# Patient Record
Sex: Male | Born: 1975 | Race: White | Hispanic: No | Marital: Married | Smoking: Never smoker
Health system: Southern US, Community
[De-identification: ages and names within clinical notes are randomized; demographics above are authoritative.]

## PROBLEM LIST (undated history)

## (undated) DIAGNOSIS — N529 Male erectile dysfunction, unspecified: Secondary | ICD-10-CM

## (undated) DIAGNOSIS — E039 Hypothyroidism, unspecified: Secondary | ICD-10-CM

---

## 2009-02-03 ENCOUNTER — Ambulatory Visit: Payer: Self-pay | Admitting: Occupational Medicine

## 2009-02-03 DIAGNOSIS — R109 Unspecified abdominal pain: Secondary | ICD-10-CM | POA: Insufficient documentation

## 2009-02-03 DIAGNOSIS — N200 Calculus of kidney: Secondary | ICD-10-CM

## 2009-02-03 LAB — CONVERTED CEMR LAB
Glucose, Urine, Semiquant: NEGATIVE
Ketones, urine, test strip: NEGATIVE
Nitrite: NEGATIVE
Urobilinogen, UA: 0.2
WBC Urine, dipstick: NEGATIVE

## 2010-01-20 ENCOUNTER — Ambulatory Visit: Payer: Self-pay | Admitting: Family Medicine

## 2010-01-20 DIAGNOSIS — J069 Acute upper respiratory infection, unspecified: Secondary | ICD-10-CM | POA: Insufficient documentation

## 2010-01-20 DIAGNOSIS — K219 Gastro-esophageal reflux disease without esophagitis: Secondary | ICD-10-CM | POA: Insufficient documentation

## 2010-01-20 DIAGNOSIS — J0100 Acute maxillary sinusitis, unspecified: Secondary | ICD-10-CM

## 2010-06-05 ENCOUNTER — Ambulatory Visit: Payer: Self-pay | Admitting: Family Medicine

## 2010-06-05 DIAGNOSIS — M79609 Pain in unspecified limb: Secondary | ICD-10-CM

## 2010-06-18 ENCOUNTER — Encounter: Payer: Self-pay | Admitting: Family Medicine
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
## 2010-12-22 NOTE — Letter (Signed)
Summary: External Correspondence  External Correspondence   Imported By: Dannette Barbara 06/18/2010 10:00:31  _____________________________________________________________________  External Attachment:    Type:   Image     Comment:   External Document

## 2010-12-22 NOTE — Assessment & Plan Note (Signed)
Summary: EARACHE & SORE THROAT/KH   Vital Signs:  Patient Profile:   35 Years Old Male CC:      pressure to bilateral ears, runny nose, productive cough, and HAs X 5 days Height:     72 inches Weight:      191 pounds O2 Sat:      99 % O2 treatment:    Room Air Temp:     97.5 degrees F oral Pulse rate:   83 / minute Pulse rhythm:   regular Resp:     14 per minute BP sitting:   123 / 78  (right arm)  Vitals Entered By: Lajean Saver, RN                  Updated Prior Medication List: NASONEX 50 MCG/ACT SUSP (MOMETASONE FUROATE) once daily  Current Allergies (reviewed today): No known allergies History of Present Illness Chief Complaint: pressure to bilateral ears, runny nose, productive cough, and HAs X 5 days History of Present Illness: Subjective: Patient complains of URI symptoms for one week, starting with sinus congestion followed by mild sore throat.  He has had a seasonal flu shot. + cough for 3 days No pleuritic pain No wheezing + post-nasal drainage + sinus pain/pressure No itchy/red eyes + left earache No hemoptysis No SOB No fever, + chills No nausea No vomiting No abdominal pain No diarrhea No skin rashes + fatigue No myalgias No headache Used OTC meds without relief   REVIEW OF SYSTEMS Constitutional Symptoms       Complains of fatigue.     Denies fever, chills, night sweats, weight loss, and weight gain.  Eyes       Denies change in vision, eye pain, eye discharge, glasses, contact lenses, and eye surgery. Ear/Nose/Throat/Mouth       Complains of ear pain, frequent runny nose, and sinus problems.      Denies hearing loss/aids, change in hearing, ear discharge, dizziness, frequent nose bleeds, sore throat, hoarseness, and tooth pain or bleeding.  Respiratory       Complains of productive cough.      Denies dry cough, wheezing, shortness of breath, asthma, bronchitis, and emphysema/COPD.  Cardiovascular       Denies murmurs, chest pain, and  tires easily with exhertion.    Gastrointestinal       Denies stomach pain, nausea/vomiting, diarrhea, constipation, blood in bowel movements, and indigestion. Genitourniary       Denies painful urination, kidney stones, and loss of urinary control. Neurological       Denies paralysis, seizures, and fainting/blackouts. Musculoskeletal       Denies muscle pain, joint pain, joint stiffness, decreased range of motion, redness, swelling, muscle weakness, and gout.  Skin       Denies bruising, unusual mles/lumps or sores, and hair/skin or nail changes.  Psych       Denies mood changes, temper/anger issues, anxiety/stress, speech problems, depression, and sleep problems.  Past History:  Past Medical History: GERD- mild  Past Surgical History: Reviewed history from 02/03/2009 and no changes required. deviated septum repair Dec 08  Family History: Reviewed history from 02/03/2009 and no changes required. Mother alive age 79 Father alive age 51 Sister alive age 18  Social History: [employer] 1/2 pkg daily x 9 years Denies ETOH or rec drugs   Objective:  Appearance:  Patient appears healthy, stated age, and in no acute distress j Eyes:  Pupils are equal, round, and reactive to light and accomdation.  Extraocular movement is intact.  Conjunctivae are not inflamed.  Ears:  Canals normal.  Tympanic membranes normal.   Nose:  Normal septum.  Normal turbinates, mildly congested.  Maxillary sinus tenderness present.  Pharynx:  Normal  Neck:  Supple.  No adenopathy is present.  No thyromegaly is present  Lungs:  Clear to auscultation.  Breath sounds are equal.  Heart:  Regular rate and rhythm without murmurs, rubs, or gallops.  Abdomen:  Nontender without masses or hepatosplenomegaly.  Bowel sounds are present.  No CVA or flank tenderness.  Assessment New Problems: ACUTE MAXILLARY SINUSITIS (ICD-461.0) URI (ICD-465.9) GERD (ICD-530.81)  Suspect viral URI with secondary  sinusitis  Plan New Medications/Changes: BENZONATATE 200 MG CAPS (BENZONATATE) One by mouth hs as needed cough  #12 x 0, 01/20/2010, Donna Christen MD AZITHROMYCIN 250 MG TABS (AZITHROMYCIN) Two tabs by mouth on day 1, then 1 tab daily on days 2 through 5  #6 tabs x 0, 01/20/2010, Donna Christen MD  New Orders: Est. Patient Level III [phone] Planning Comments:   Begin Z-pack, decongestants, cough suppressant at night if cough worsens. Follow-up with PCP if not improving.   The patient and/or caregiver has been counseled thoroughly with regard to medications prescribed including dosage, schedule, interactions, rationale for use, and possible side effects and they verbalize understanding.  Diagnoses and expected course of recovery discussed and will return if not improved as expected or if the condition worsens. Patient and/or caregiver verbalized understanding.  Prescriptions: BENZONATATE 200 MG CAPS (BENZONATATE) One by mouth hs as needed cough  #12 x 0   Entered and Authorized by:   Donna Christen MD   Signed by:   Donna Christen MD on 01/20/2010   Method used:   Print then Give to Patient   RxID:   [id number] AZITHROMYCIN 250 MG TABS (AZITHROMYCIN) Two tabs by mouth on day 1, then 1 tab daily on days 2 through 5  #6 tabs x 0   Entered and Authorized by:   Donna Christen MD   Signed by:   Donna Christen MD on 01/20/2010   Method used:   Print then Give to Patient   RxID:   [id number]   Patient Instructions: 1)  May use Mucinex D (guaifenesin with decongestant) twice daily for congestion. 2)  Increase fluid intake, rest. 3)  May use Afrin nasal spray (or generic oxymetazoline) twice daily for about 5 days.  Also recommend using saline nasal spray several times daily and/or saline nasal irrigation. 4)  Followup with family doctor if not improving 7 to 10 days.

## 2010-12-23 IMAGING — CR DG FOOT COMPLETE 3+V*L*
3 series · 3 of 3 positions shown · non-contrast
Comparison: None

CLINICAL DATA: Left heel pain.

LEFT FOOT - COMPLETE 3+ VIEW

[view not recorded (1 of 3)]
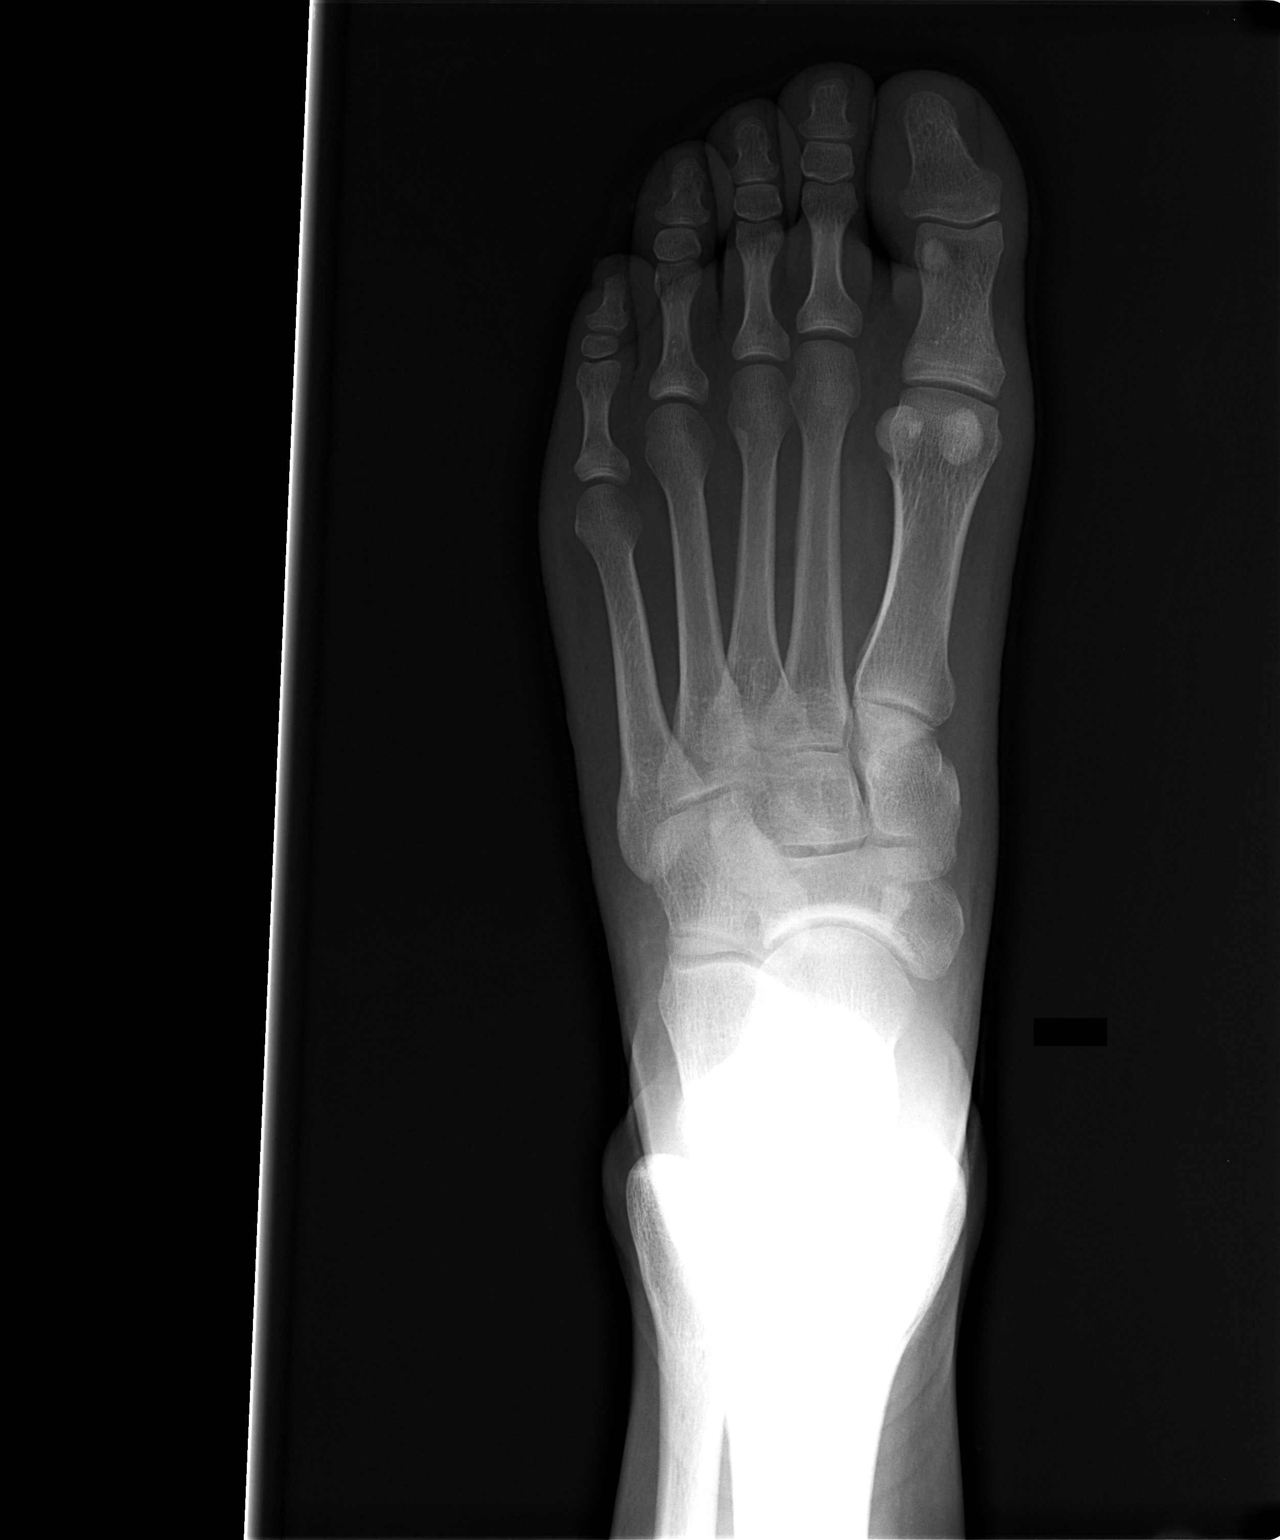

[view not recorded (2 of 3)]
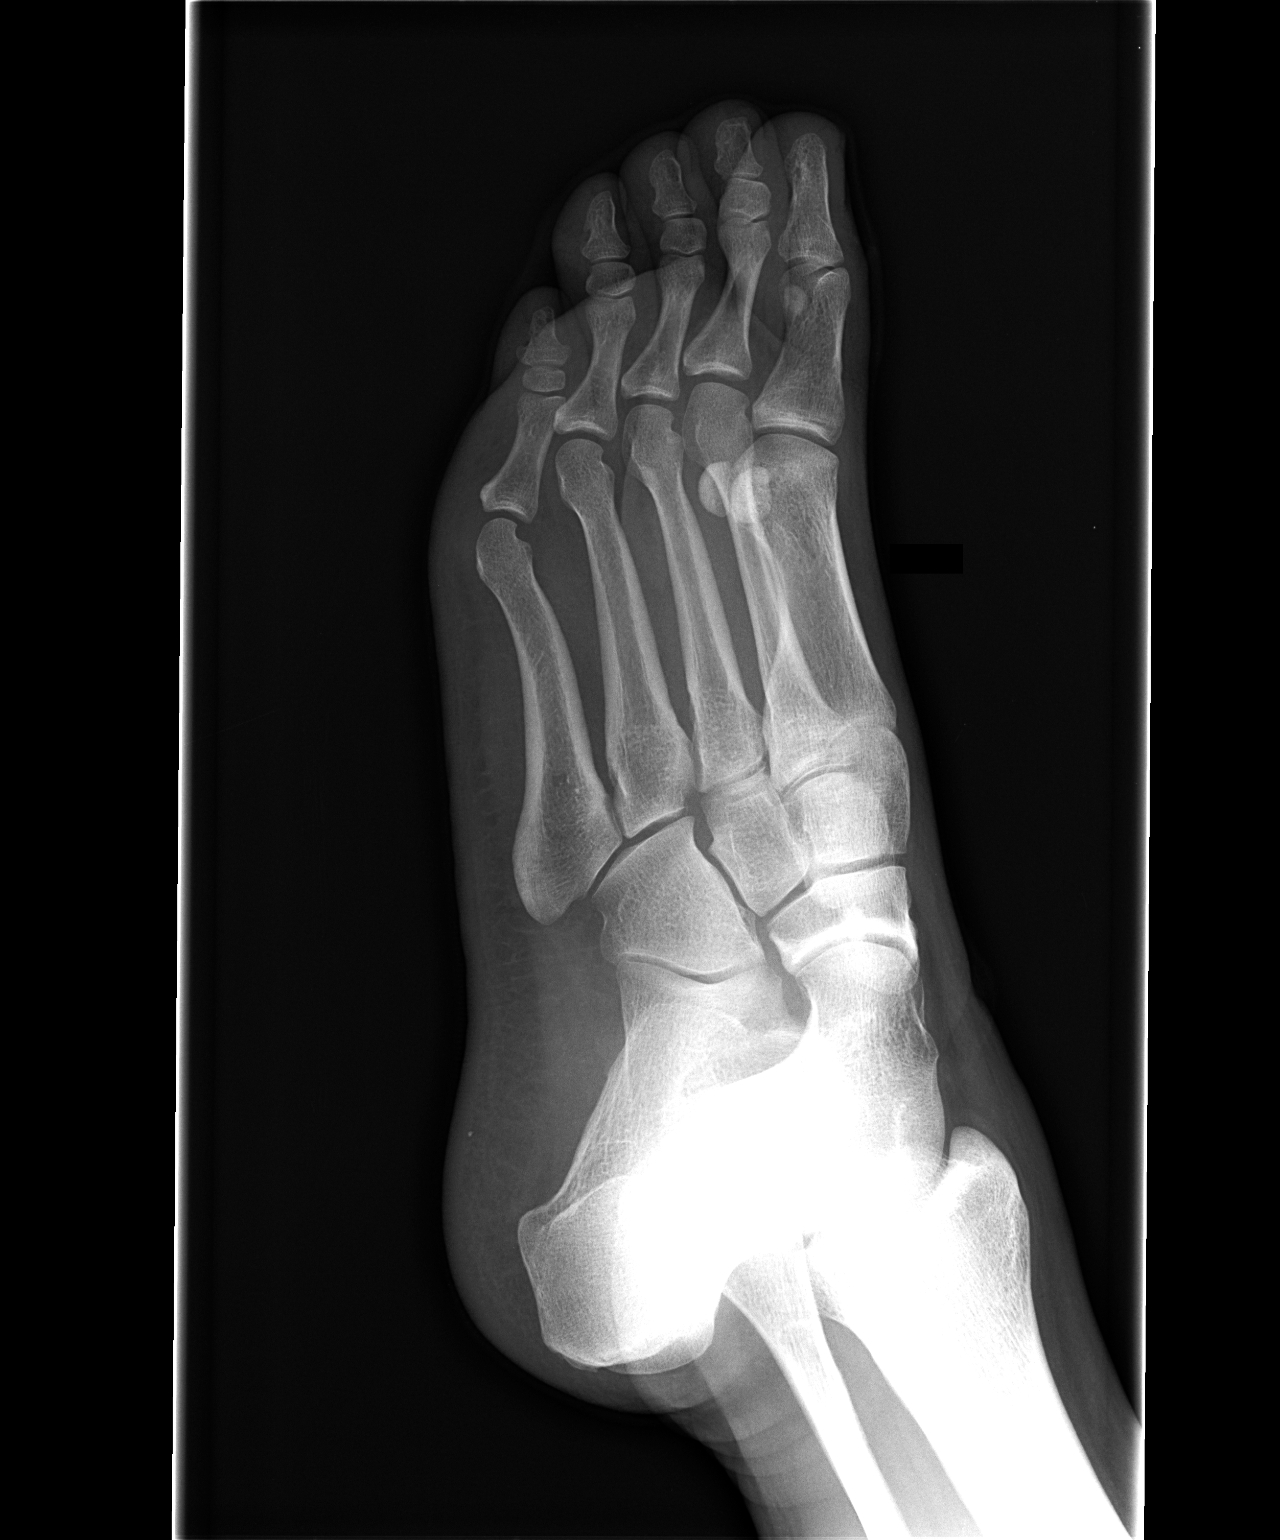

[view not recorded (3 of 3)]
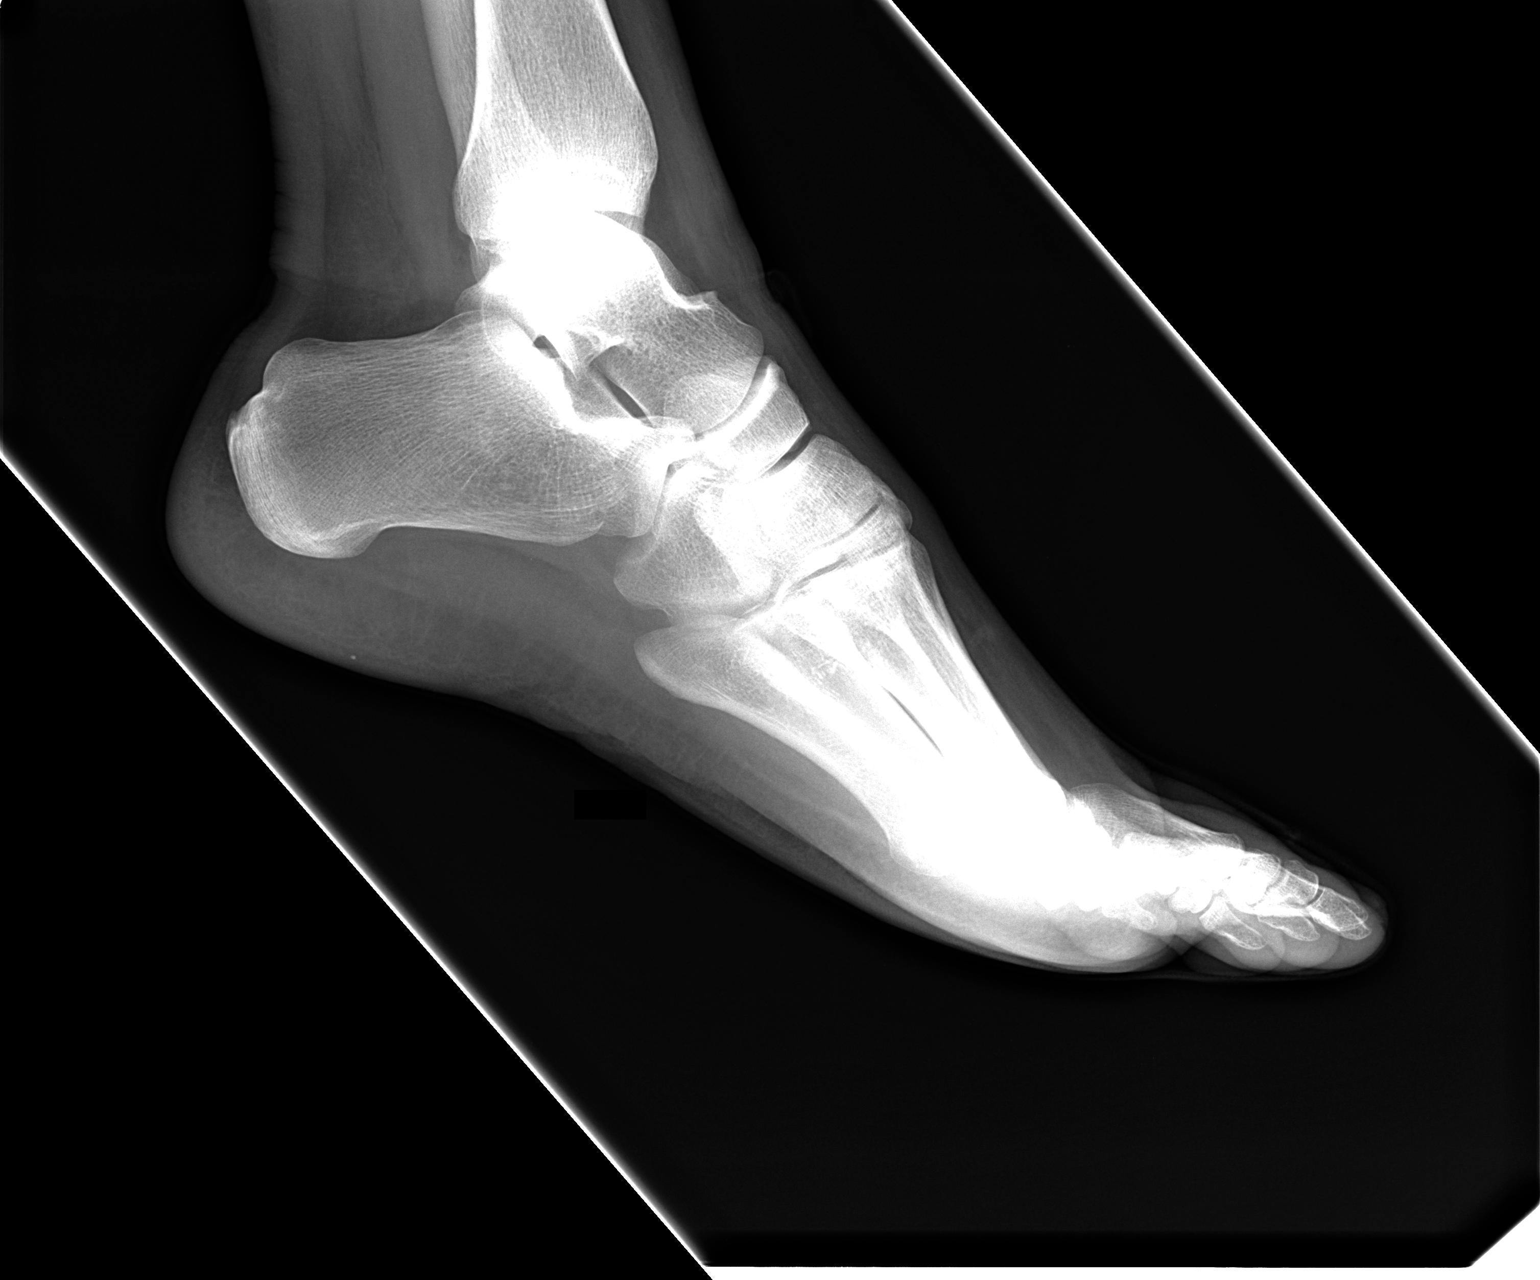

[3 of 3 positions shown; findings below may reference images not displayed]

FINDINGS: The joint spaces are maintained.  No acute bony findings
or degenerative changes.  No calcaneal heel spur is demonstrated.
There is a tiny calcification noted in the plantar aspect of the
heel likely in the dermis.
IMPRESSION: No acute bony findings.

## 2019-12-03 ENCOUNTER — Other Ambulatory Visit: Payer: Self-pay

## 2019-12-03 ENCOUNTER — Emergency Department
Admission: EM | Admit: 2019-12-03 | Discharge: 2019-12-03 | Disposition: A | Discharge status: Home / Self Care | Payer: 59 | Source: Home / Self Care | Attending: Family Medicine | Admitting: Family Medicine

## 2019-12-03 DIAGNOSIS — H01001 Unspecified blepharitis right upper eyelid: Secondary | ICD-10-CM

## 2019-12-03 HISTORY — DX: Hypothyroidism, unspecified: E03.9

## 2019-12-03 HISTORY — DX: Male erectile dysfunction, unspecified: N52.9

## 2019-12-03 MED ORDER — SULFACETAMIDE SODIUM 10 % OP SOLN: 1.0000 [drp] | OPHTHALMIC | 0 refills | Status: DC

## 2019-12-03 NOTE — Discharge Instructions (Signed)
Apply warm compresses to your eyes 2 times per day for 10 minutes at a time.

## 2019-12-03 NOTE — ED Provider Notes (Signed)
[facility]    CSN: [id number] Arrival date & time: 12/03/19  1624      History   Chief Complaint Chief Complaint  Patient presents with  . Eye Problem    pain    HPI Matthew Solis is a 44 y.o. male.   Patient complains of onset of right upper eyelid pain last night.  He has pain when blinking, but denies foreign body sensation.  He recalls no injury, and no change in vision.  The history is provided by the patient.  Eye Problem Location:  Right eye Quality:  Sharp Severity:  Mild Onset quality:  Sudden Timing:  Constant Progression:  Unchanged Chronicity:  New Context: not burn, not chemical exposure, not contact lens problem, not direct trauma, not foreign body, not using machinery, not scratch, not smoke exposure and not UV exposure   Relieved by:  Nothing Exacerbated by: blinking. Ineffective treatments:  Eye drops Associated symptoms: redness   Associated symptoms: no blurred vision, no crusting, no decreased vision, no discharge, no double vision, no facial rash, no headaches, no inflammation, no itching, no photophobia, no scotomas, no swelling and no tearing     Past Medical History:  Diagnosis Date  . Erectile dysfunction   . Hypothyroidism     Patient Active Problem List   Diagnosis Date Noted  . HEEL PAIN, LEFT 06/05/2010  . ACUTE MAXILLARY SINUSITIS 01/20/2010  . URI 01/20/2010  . GERD 01/20/2010  . NEPHROLITHIASIS 02/03/2009  . ABDOMINAL PAIN, ACUTE 02/03/2009    History reviewed. No pertinent surgical history.     Home Medications    Prior to Admission medications   Medication Sig Start Date End Date Taking? Authorizing Provider  clotrimazole-betamethasone (LOTRISONE) cream Apply topically. 11/12/19 11/11/20 Yes [provider]  levothyroxine (SYNTHROID) 112 MCG tablet Take by mouth. 11/12/19  Yes [provider]  sildenafil (REVATIO) 20 MG tablet Take as few as 2 and as many as 5 by mouth at least one hour  prior to sex 04/07/18  Yes [provider]  Multiple Vitamins-Minerals (MULTIVITAMIN WITH MINERALS) tablet Take by mouth.    [provider]  sulfacetamide (BLEPH-10) 10 % ophthalmic solution Place 1-2 drops into the right eye every 3 (three) hours. (use while awake) 12/03/19   Kevron Patella, Tera Mater, MD    Family History History reviewed. No pertinent family history.  Social History Social History   Tobacco Use  . Smoking status: Never Smoker  . Smokeless tobacco: Never Used  Substance Use Topics  . Alcohol use: Never  . Drug use: Never     Allergies   Patient has no known allergies.   Review of Systems Review of Systems  Eyes: Positive for pain and redness. Negative for blurred vision, double vision, photophobia, discharge, itching and visual disturbance.  Neurological: Negative for headaches.  All other systems reviewed and are negative.    Physical Exam Triage Vital Signs ED Triage Vitals  Enc Vitals Group     BP 12/03/19 1805 121/83     Pulse Rate 12/03/19 1805 66     Resp 12/03/19 1805 16     Temp 12/03/19 1805 98.6 F (37 C)     Temp Source 12/03/19 1805 Oral     SpO2 12/03/19 1805 100 %     Weight 12/03/19 1807 224 lb (101.6 kg)     Height 12/03/19 1807 5\' 11"  (1.803 m)     Head Circumference --      Peak Flow --  Pain Score 12/03/19 1806 6     Pain Loc --      Pain Edu? --      Excl. in [place]? --    No data found.  Updated Vital Signs BP 121/83 (BP Location: Right Arm)   Pulse 66   Temp 98.6 F (37 C) (Oral)   Resp 16   Ht 5\' 11"  (1.803 m)   Wt 101.6 kg   SpO2 100%   BMI 31.24 kg/m   Visual Acuity Right Eye Distance: 20/30 Left Eye Distance: 20/40 Bilateral Distance: 20/25  Right Eye Near:   Left Eye Near:    Bilateral Near:     Physical Exam Vitals and nursing note reviewed.  Constitutional:      General: He is not in acute distress. HENT:     Head: Normocephalic.     Right Ear: External ear normal.     Left Ear:  External ear normal.     Nose: Nose normal.     Mouth/Throat:     Pharynx: Oropharynx is clear.  Eyes:     General: Lids are everted, no foreign bodies appreciated. Vision grossly intact.        Right eye: No foreign body, discharge or hordeolum.     Extraocular Movements: Extraocular movements intact.     Right eye: Normal extraocular motion.     Conjunctiva/sclera: Conjunctivae normal.     Right eye: Right conjunctiva is not injected. No chemosis, exudate or hemorrhage.    Pupils: Pupils are equal, round, and reactive to light.      Comments: Fluorescein to right eye:  No uptake. Right upper eyelid margin slightly swollen and tender to palpation medially.  Cardiovascular:     Rate and Rhythm: Normal rate.  Pulmonary:     Effort: Pulmonary effort is normal.  Lymphadenopathy:     Cervical: No cervical adenopathy.  Skin:    General: Skin is warm and dry.     Findings: No rash.  Neurological:     Mental Status: He is alert.      UC Treatments / Results  Labs (all labs ordered are listed, but only abnormal results are displayed) Labs Reviewed - No data to display  EKG   Radiology No results found.  Procedures Procedures (including critical care time)  Medications Ordered in UC Medications - No data to display  Initial Impression / Assessment and Plan / UC Course  I have reviewed the triage vital signs and the nursing notes.  Pertinent labs & imaging results that were available during my care of the patient were reviewed by me and considered in my medical decision making (see chart for details).    Begin sulphacetamide ophthalmic suspension. Followup with ophthalmologist if not improved about 4 days.   Final Clinical Impressions(s) / UC Diagnoses   Final diagnoses:  Blepharitis of right upper eyelid, unspecified type     Discharge Instructions     Apply warm compresses to your eyes 2 times per day for 10 minutes at a time.    ED Prescriptions     Medication Sig Dispense Auth. Provider   sulfacetamide (BLEPH-10) 10 % ophthalmic solution Place 1-2 drops into the right eye every 3 (three) hours. (use while awake) 5 mL Kandra Nicolas, MD        Kandra Nicolas, MD 12/05/19 2012

## 2019-12-03 NOTE — ED Triage Notes (Signed)
Pt reports sharp eye pain (Right) 6/10 that started last night. Pt has tried clear eyes for comfort. No improvement at this time. Sclera is slightly red. Denies any other symptoms.

## 2020-01-10 ENCOUNTER — Emergency Department (INDEPENDENT_AMBULATORY_CARE_PROVIDER_SITE_OTHER): Payer: 59

## 2020-01-10 ENCOUNTER — Other Ambulatory Visit: Payer: Self-pay

## 2020-01-10 ENCOUNTER — Emergency Department
Admission: EM | Admit: 2020-01-10 | Discharge: 2020-01-10 | Disposition: A | Discharge status: Home / Self Care | Payer: 59 | Source: Home / Self Care

## 2020-01-10 ENCOUNTER — Encounter: Payer: Self-pay | Admitting: Family Medicine

## 2020-01-10 DIAGNOSIS — M25551 Pain in right hip: Secondary | ICD-10-CM | POA: Diagnosis not present

## 2020-01-10 LAB — POCT URINALYSIS DIP (MANUAL ENTRY)
Bilirubin, UA: NEGATIVE
Blood, UA: NEGATIVE
Glucose, UA: NEGATIVE mg/dL
Ketones, POC UA: NEGATIVE mg/dL
Leukocytes, UA: NEGATIVE
Nitrite, UA: NEGATIVE
Protein Ur, POC: NEGATIVE mg/dL
Spec Grav, UA: >=1.03 — AB (ref 1.010–1.025)
Urobilinogen, UA: 0.2 E.U./dL
pH, UA: 5.5 (ref 5.0–8.0)

## 2020-01-10 MED ORDER — PREDNISONE 20 MG PO TABS: ORAL_TABLET | ORAL | 1 refills | Status: AC

## 2020-01-10 NOTE — Discharge Instructions (Signed)
Please return if not significantly better in 2-3 days

## 2020-01-10 NOTE — ED Triage Notes (Signed)
Pain to R hip & groin x 2 weeks- came in today -appt at PCP today but office was closed - directed here by PCP

## 2020-01-10 NOTE — ED Provider Notes (Signed)
[name]    CSN: [id number] Arrival date & time: 01/10/20  1500      History   Chief Complaint Chief Complaint  Patient presents with  . Hip Pain  . Groin Pain    HPI Matthew Solis is a 44 y.o. male.   44 yo established [place] patient complaining of hip and groin pain.   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  He does no lifting and cannot recall any past injury.  He has had kidney stones but usually they feel a little different.  Patient had no nausea, vomiting, fever, or hematuria.  Pain is constant but made worse when he sits or goes from a sitting to a lying position.  Standing seems to help relieve some of the pain.       Past Medical History:  Diagnosis Date  . Erectile dysfunction   . Hypothyroidism     Patient Active Problem List   Diagnosis Date Noted  . HEEL PAIN, LEFT 06/05/2010  . ACUTE MAXILLARY SINUSITIS 01/20/2010  . URI 01/20/2010  . GERD 01/20/2010  . NEPHROLITHIASIS 02/03/2009  . ABDOMINAL PAIN, ACUTE 02/03/2009    History reviewed. No pertinent surgical history.     Home Medications    Prior to Admission medications   Medication Sig Start Date End Date Taking? Authorizing Provider  levothyroxine (SYNTHROID) 112 MCG tablet Take by mouth. 11/12/19   [provider]  Multiple Vitamins-Minerals (MULTIVITAMIN WITH MINERALS) tablet Take by mouth.    [provider]  predniSONE (DELTASONE) 20 MG tablet 2 daily with food 01/10/20   Robyn Haber, MD  sildenafil (REVATIO) 20 MG tablet Take as few as 2 and as many as 5 by mouth at least one hour prior to sex 04/07/18   [provider]    Family History History reviewed. No pertinent family history.  Social History Social History   Tobacco Use  . Smoking status: Never Smoker  . Smokeless tobacco: Never Used    Substance Use Topics  . Alcohol use: Never  . Drug use: Never     Allergies   Patient has no known allergies.   Review of Systems Review of Systems  Musculoskeletal: Positive for gait problem.  All other systems reviewed and are negative.    Physical Exam Triage Vital Signs ED Triage Vitals  Enc Vitals Group     BP      Pulse      Resp      Temp      Temp src      SpO2      Weight      Height      Head Circumference      Peak Flow      Pain Score      Pain Loc      Pain Edu?      Excl. in [place]?    No data found.  Updated Vital Signs BP (!) 126/96 (BP Location: Right Arm)   Pulse 74   Temp 98 F (36.7 C) (Oral)   Resp 14   Ht 5\' 10"  (1.778 m)   Wt 99.8 kg   SpO2 98%   BMI 31.57 kg/m   Physical Exam Vitals and nursing note reviewed.  Constitutional:      Appearance: Normal appearance.  He is obese.  HENT:     Head: Normocephalic.  Eyes:     Conjunctiva/sclera: Conjunctivae normal.  Cardiovascular:     Rate and Rhythm: Normal rate.  Pulmonary:     Effort: Pulmonary effort is normal.  Abdominal:     General: Abdomen is flat. There is no distension.     Palpations: There is no mass.     Tenderness: There is no abdominal tenderness. There is no right CVA tenderness, guarding or rebound.     Hernia: No hernia is present.  Genitourinary:    Penis: Normal.      Testes: Normal.  Musculoskeletal:        General: Normal range of motion.     Cervical back: Normal range of motion and neck supple.  Skin:    General: Skin is warm and dry.  Neurological:     General: No focal deficit present.     Mental Status: He is alert and oriented to person, place, and time.  Psychiatric:        Mood and Affect: Mood normal.        Behavior: Behavior normal.        Thought Content: Thought content normal.        Judgment: Judgment normal.      UC Treatments / Results  Labs (all labs ordered are listed, but only abnormal results are displayed) Labs Reviewed   POCT URINALYSIS DIP (MANUAL ENTRY) - Abnormal; Notable for the following components:      Result Value   Spec Grav, UA >=1.030 (*)    All other components within normal limits    EKG   Radiology DG Hip Unilat W or Wo Pelvis 2-3 Views Right  Result Date: 01/10/2020 CLINICAL DATA:  Right hip and groin pain. EXAM: DG HIP (WITH OR WITHOUT PELVIS) 2-3V RIGHT COMPARISON:  Abdomen 02/03/2009. FINDINGS: No acute bony abnormality identified. Mild degenerative changes lumbar spine and both hips. Tiny stable sclerotic density noted the left acetabulum most likely tiny bone island. Pelvic calcifications consistent with phleboliths. IMPRESSION: Mild degenerative changes lumbar spine and both hips. No acute abnormality. Electronically Signed   By: Maisie Fus  Register   On: 01/10/2020 16:08    Procedures Procedures (including critical care time)  Medications Ordered in UC Medications - No data to display  Initial Impression / Assessment and Plan / UC Course  I have reviewed the triage vital signs and the nursing notes.  Pertinent labs & imaging results that were available during my care of the patient were reviewed by me and considered in my medical decision making (see chart for details).    Final Clinical Impressions(s) / UC Diagnoses   Final diagnoses:  Right hip pain     Discharge Instructions     Please return if not significantly better in 2-3 days    ED Prescriptions    Medication Sig Dispense Auth. Provider   predniSONE (DELTASONE) 20 MG tablet 2 daily with food 10 tablet Elvina Sidle, MD     I have reviewed the PDMP during this encounter.   Elvina Sidle, MD 01/10/20 1616

## 2020-07-29 IMAGING — DX DG HIP (WITH OR WITHOUT PELVIS) 2-3V*R*
3 series · 3 of 3 positions shown · non-contrast
Comparison: Abdomen 02/03/2009.

CLINICAL DATA: Right hip and groin pain.

EXAM:
DG HIP (WITH OR WITHOUT PELVIS) 2-3V RIGHT

[pelvis ap]
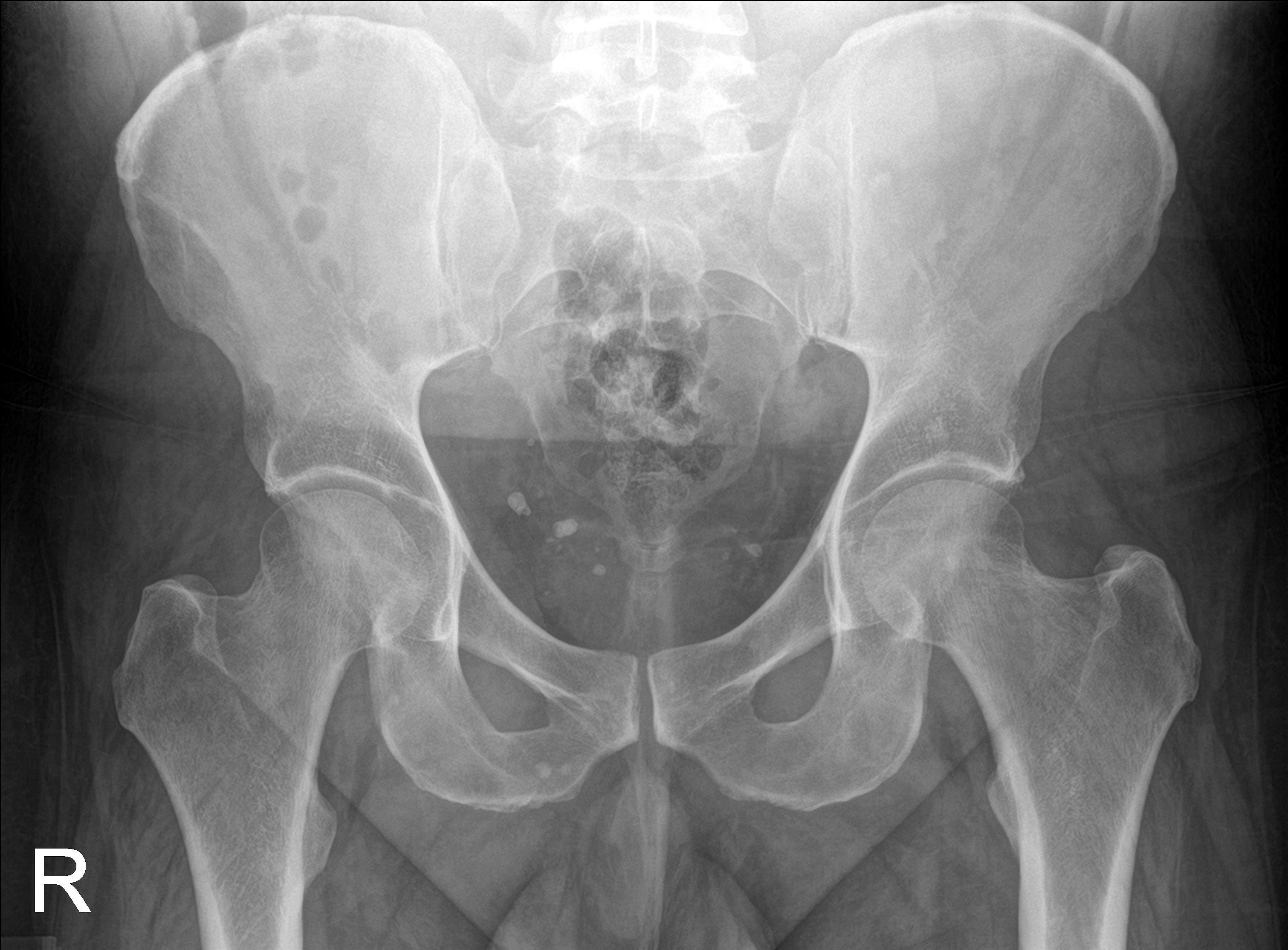

[hip ap]
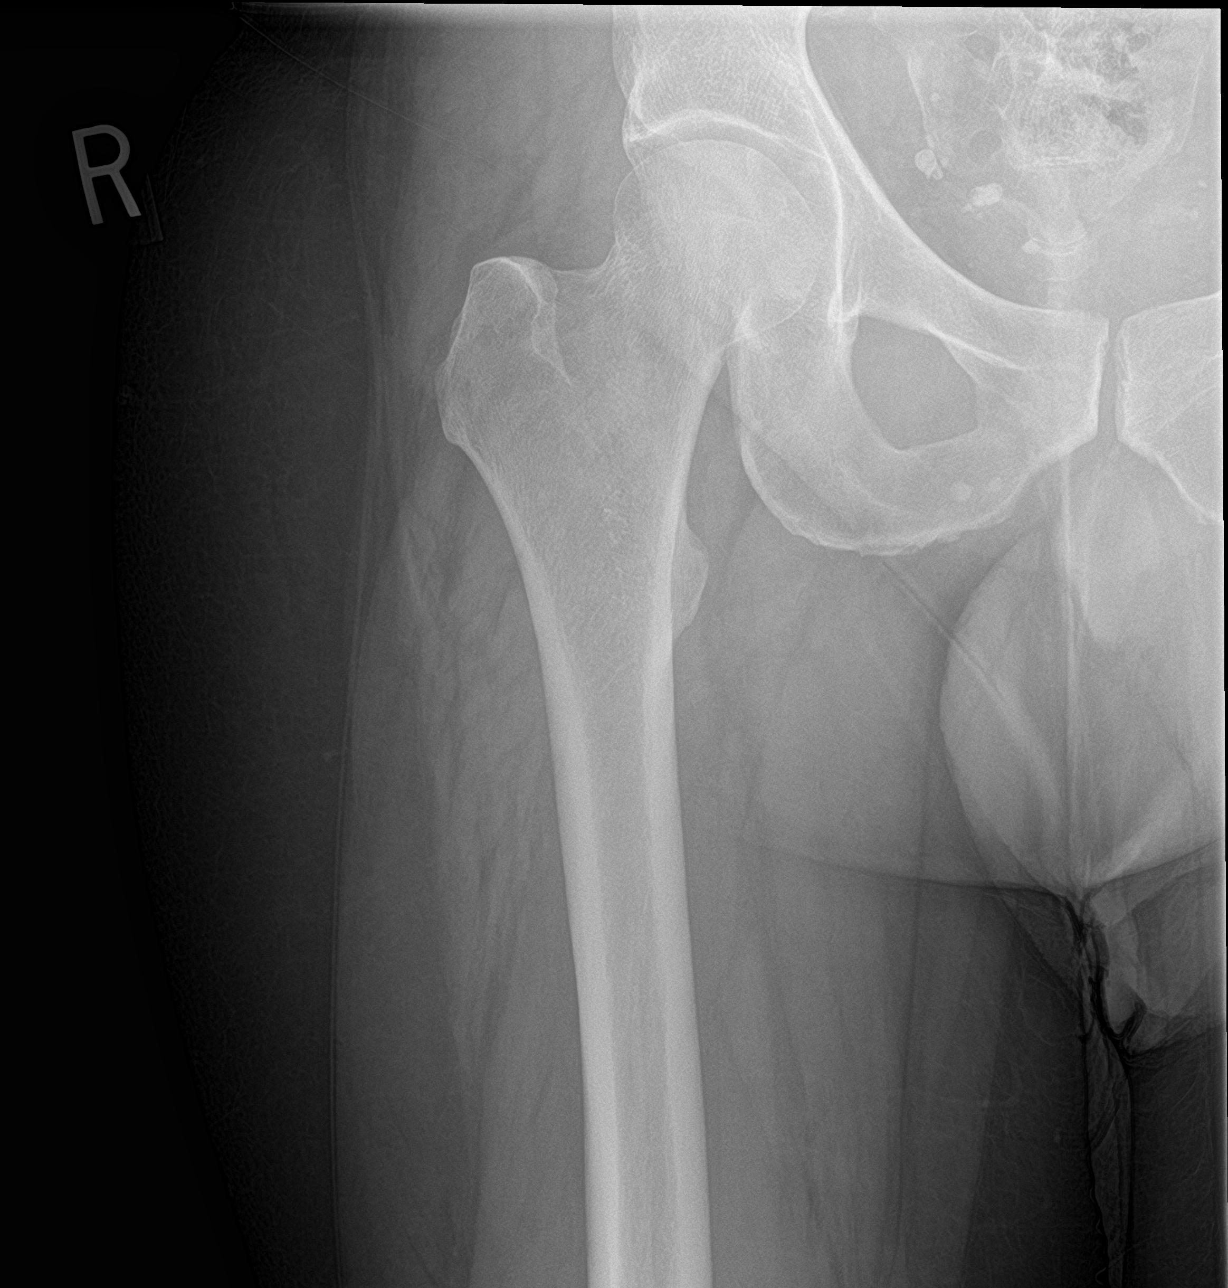

[hip lat]
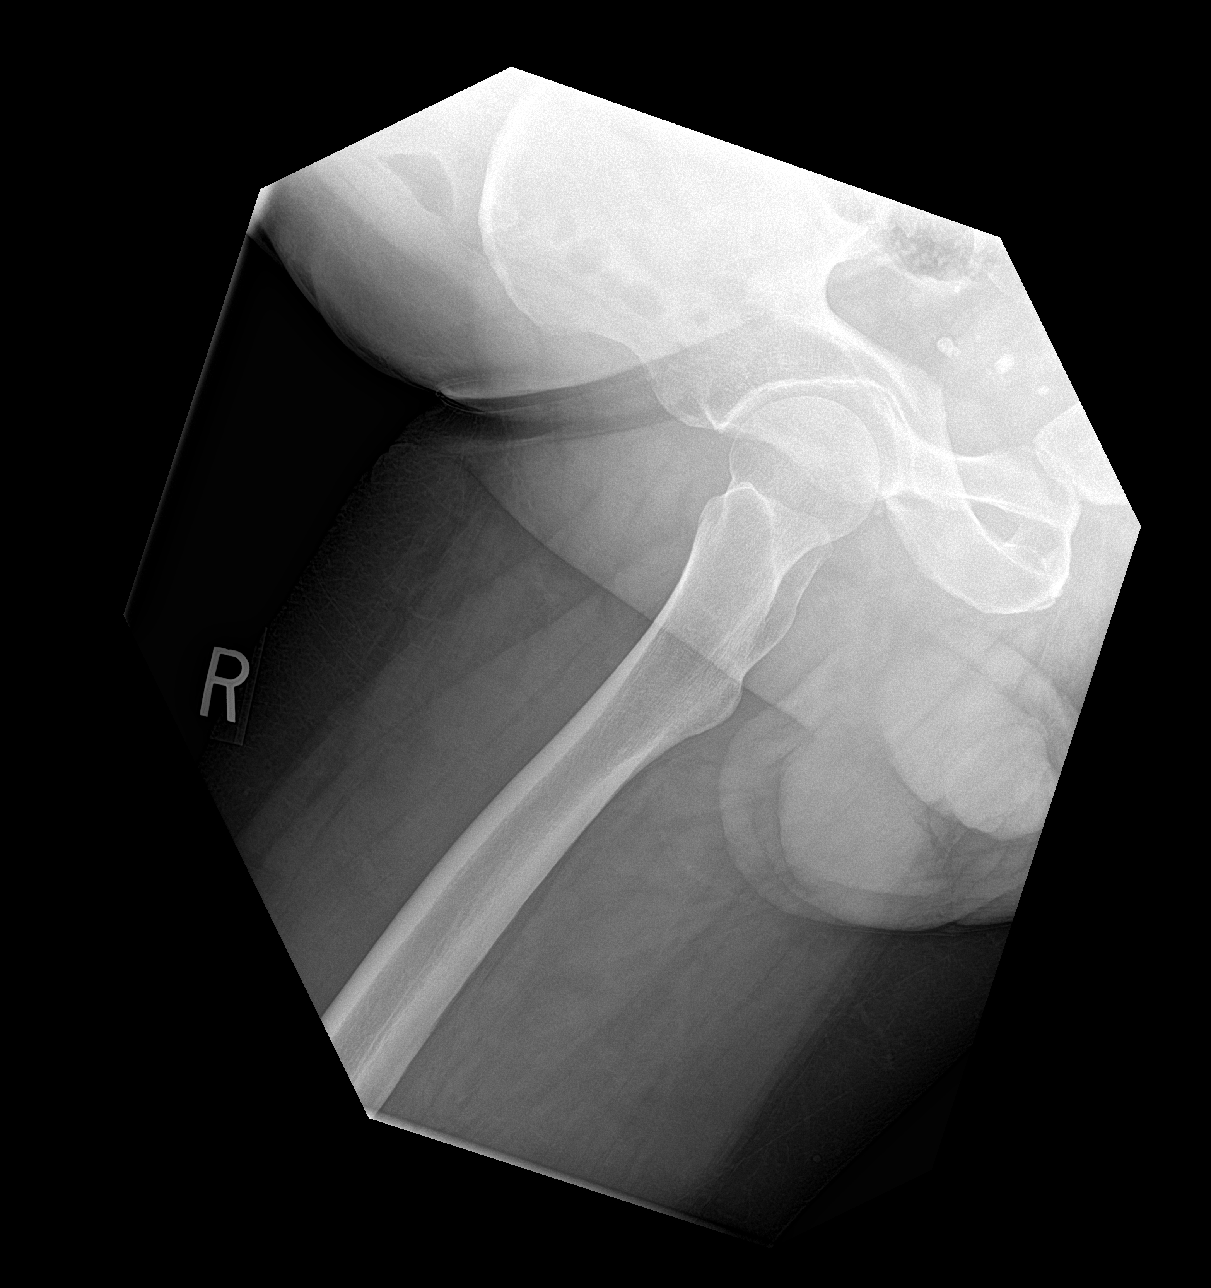

[3 of 3 positions shown; findings below may reference images not displayed]

FINDINGS: No acute bony abnormality identified. Mild degenerative changes
lumbar spine and both hips. Tiny stable sclerotic density noted the
left acetabulum most likely tiny bone island. Pelvic calcifications
consistent with phleboliths.
IMPRESSION: Mild degenerative changes lumbar spine and both hips. No acute
abnormality.
